# Patient Record
Sex: Male | Born: 1960
Health system: Southern US, Community
[De-identification: ages and names within clinical notes are randomized; demographics above are authoritative.]

## PROBLEM LIST (undated history)

## (undated) DIAGNOSIS — I447 Left bundle-branch block, unspecified: Secondary | ICD-10-CM

## (undated) DIAGNOSIS — K219 Gastro-esophageal reflux disease without esophagitis: Secondary | ICD-10-CM

## (undated) DIAGNOSIS — F419 Anxiety disorder, unspecified: Secondary | ICD-10-CM

## (undated) DIAGNOSIS — N400 Enlarged prostate without lower urinary tract symptoms: Secondary | ICD-10-CM

## (undated) DIAGNOSIS — I209 Angina pectoris, unspecified: Secondary | ICD-10-CM

## (undated) HISTORY — DX: Gastro-esophageal reflux disease without esophagitis: K21.9

## (undated) HISTORY — DX: Benign prostatic hyperplasia without lower urinary tract symptoms: N40.0

## (undated) HISTORY — DX: Anxiety disorder, unspecified: F41.9

## (undated) HISTORY — PX: CARDIAC CATHETERIZATION: SHX172

---

## 2005-06-10 ENCOUNTER — Encounter: Admission: RE | Admit: 2005-06-10 | Discharge: 2005-06-10 | Payer: Self-pay | Admitting: Internal Medicine

## 2012-09-14 ENCOUNTER — Other Ambulatory Visit: Payer: Self-pay | Admitting: Interventional Cardiology

## 2012-09-19 ENCOUNTER — Encounter (HOSPITAL_BASED_OUTPATIENT_CLINIC_OR_DEPARTMENT_OTHER)
Admission: RE | Disposition: A | Discharge status: Home / Self Care | Payer: Self-pay | Source: Ambulatory Visit | Attending: Interventional Cardiology

## 2012-09-19 ENCOUNTER — Inpatient Hospital Stay (HOSPITAL_BASED_OUTPATIENT_CLINIC_OR_DEPARTMENT_OTHER)
Admission: RE | Admit: 2012-09-19 | Discharge: 2012-09-19 | Disposition: A | Discharge status: Home / Self Care | Payer: 59 | Source: Ambulatory Visit | Attending: Interventional Cardiology | Admitting: Interventional Cardiology

## 2012-09-19 ENCOUNTER — Encounter (HOSPITAL_BASED_OUTPATIENT_CLINIC_OR_DEPARTMENT_OTHER): Payer: Self-pay | Admitting: Interventional Cardiology

## 2012-09-19 DIAGNOSIS — I447 Left bundle-branch block, unspecified: Secondary | ICD-10-CM | POA: Insufficient documentation

## 2012-09-19 DIAGNOSIS — I209 Angina pectoris, unspecified: Secondary | ICD-10-CM | POA: Insufficient documentation

## 2012-09-19 DIAGNOSIS — I2510 Atherosclerotic heart disease of native coronary artery without angina pectoris: Secondary | ICD-10-CM | POA: Insufficient documentation

## 2012-09-19 HISTORY — DX: Angina pectoris, unspecified: I20.9

## 2012-09-19 HISTORY — DX: Left bundle-branch block, unspecified: I44.7

## 2012-09-19 SURGERY — JV LEFT HEART CATHETERIZATION WITH CORONARY ANGIOGRAM
Anesthesia: Moderate Sedation

## 2012-09-19 MED ORDER — SODIUM CHLORIDE 0.9 % IV SOLN: 250.0000 mL | INTRAVENOUS | Status: DC | PRN

## 2012-09-19 MED ORDER — ASPIRIN 81 MG PO CHEW: 81.0000 mg | CHEWABLE_TABLET | Freq: Every day | ORAL | Status: DC

## 2012-09-19 MED ORDER — DIAZEPAM 5 MG PO TABS
5.0000 mg | ORAL_TABLET | ORAL | Status: AC
  Administered 2012-09-19: 5 mg via ORAL

## 2012-09-19 MED ORDER — SODIUM CHLORIDE 0.9 % IV SOLN: INTRAVENOUS | Status: DC

## 2012-09-19 MED ORDER — SODIUM CHLORIDE 0.9 % IJ SOLN: 3.0000 mL | INTRAMUSCULAR | Status: DC | PRN

## 2012-09-19 MED ORDER — ACETAMINOPHEN 325 MG PO TABS: 650.0000 mg | ORAL_TABLET | ORAL | Status: DC | PRN

## 2012-09-19 MED ORDER — SODIUM CHLORIDE 0.9 % IJ SOLN: 3.0000 mL | Freq: Two times a day (BID) | INTRAMUSCULAR | Status: DC

## 2012-09-19 MED ORDER — ASPIRIN 81 MG PO CHEW
324.0000 mg | CHEWABLE_TABLET | ORAL | Status: AC
  Administered 2012-09-19: 324 mg via ORAL

## 2012-09-19 MED ORDER — ONDANSETRON HCL 4 MG/2ML IJ SOLN: 4.0000 mg | Freq: Four times a day (QID) | INTRAMUSCULAR | Status: DC | PRN

## 2012-09-19 MED ORDER — ONDANSETRON HCL 4 MG/2ML IJ SOLN
4.0000 mg | Freq: Three times a day (TID) | INTRAMUSCULAR | Status: DC | PRN
  Administered 2012-09-19: 4 mg via INTRAVENOUS

## 2012-09-19 NOTE — OR Nursing (Signed)
Dr Varanasi at bedside to discuss results and treatment plan with pt and family 

## 2012-09-19 NOTE — H&P (Signed)
  Date of Initial H&P: 09-12-2012  History reviewed, patient examined, no change in status, stable for surgery.

## 2012-09-19 NOTE — OR Nursing (Signed)
+  Allen's test right hand 

## 2012-09-19 NOTE — OR Nursing (Signed)
Tegaderm dressing applied, site level 0, bedrest begins at 1015 

## 2012-09-19 NOTE — CV Procedure (Signed)
PROCEDURE:  Left heart catheterization with selective coronary angiography, left ventriculogram.  Abdominal aortogram.  INDICATIONS:  Angina with exercise; rate dependent left bundle block  The risks, benefits, and details of the procedure were explained to the patient.  The patient verbalized understanding and wanted to proceed.  Informed written consent was obtained.  PROCEDURE TECHNIQUE:  After Xylocaine anesthesia a 58F sheath was placed in the right femoral artery with a single anterior needle wall stick.   Left coronary angiography was done using a Judkins L4 guide catheter.  Right coronary angiography was done using a Judkins R4 guide catheter.  Left ventriculography and aortography was done using a pigtail catheter.    CONTRAST:  Total of 100 cc.  COMPLICATIONS:  None.    HEMODYNAMICS:  Aortic pressure was 122/69; LV pressure was 11/13; LVEDP 13.  There was no gradient between the left ventricle and aorta.    ANGIOGRAPHIC DATA:   The left main coronary artery is short but widely patent.  The left anterior descending artery is a large vessel with mild atherosclerosis proximally.  First diagonal is medium sized and patent.  Second diagonal is large and patent.  The left circumflex artery is a large vessel which appears angiographically normal.  OM1 is large and widely patent.  Small continuation branch of the circumflex.  The right coronary artery is a large dominant vessel with mild atherosclerosis in the mid vessel.  PDA is medium sized and patent  LEFT VENTRICULOGRAM:  Left ventricular angiogram was done in the 30 RAO projection and revealed normal left ventricular wall motion and systolic function with an estimated ejection fraction of 60%.  LVEDP was 13 mmHg.  ABDOMINAL AORTOGRAM: No abdominal aortic aneurysm.  Dual arterial supply to the right kidney, both branches are patent.  Single left renal artery which is widely patent.  Aortoiliac bifurcation is widely  patent.  IMPRESSIONS:  1. Normal left main coronary artery. 2. Mild atherosclerosis in the left anterior descending artery.  Widely patent branches. 3. Normal left circumflex artery and its branches. 4. Minimal atherosclerosis in the right coronary artery. 5. Normal left ventricular systolic function.  LVEDP 13 mmHg.  Ejection fraction 60%.  RECOMMENDATION:  Continue preventive therapy.

## 2012-09-19 NOTE — OR Nursing (Signed)
Discharge instructions reviewed and signed, pt stated understanding, ambulated in hall without difficulty, site level 0, transported to wife's car via wheelchair 

## 2012-12-20 ENCOUNTER — Encounter: Payer: Self-pay | Admitting: *Deleted

## 2012-12-20 ENCOUNTER — Encounter: Payer: Self-pay | Admitting: Interventional Cardiology

## 2012-12-20 DIAGNOSIS — F419 Anxiety disorder, unspecified: Secondary | ICD-10-CM | POA: Insufficient documentation

## 2012-12-22 ENCOUNTER — Ambulatory Visit: Payer: 59 | Admitting: Interventional Cardiology

## 2014-02-20 ENCOUNTER — Encounter: Payer: Self-pay | Admitting: Interventional Cardiology

## 2016-06-16 ENCOUNTER — Other Ambulatory Visit: Payer: Self-pay | Admitting: Gastroenterology

## 2016-06-16 DIAGNOSIS — R1310 Dysphagia, unspecified: Secondary | ICD-10-CM | POA: Diagnosis not present

## 2016-06-16 DIAGNOSIS — R1010 Upper abdominal pain, unspecified: Secondary | ICD-10-CM | POA: Diagnosis not present

## 2016-06-16 DIAGNOSIS — R1319 Other dysphagia: Secondary | ICD-10-CM

## 2016-06-18 ENCOUNTER — Ambulatory Visit
Admission: RE | Admit: 2016-06-18 | Discharge: 2016-06-18 | Disposition: A | Discharge status: Home / Self Care | Payer: 59 | Source: Ambulatory Visit | Attending: Gastroenterology | Admitting: Gastroenterology

## 2016-06-18 DIAGNOSIS — R1310 Dysphagia, unspecified: Secondary | ICD-10-CM

## 2016-06-18 DIAGNOSIS — R1319 Other dysphagia: Secondary | ICD-10-CM

## 2016-06-18 DIAGNOSIS — K219 Gastro-esophageal reflux disease without esophagitis: Secondary | ICD-10-CM | POA: Diagnosis not present

## 2016-08-12 DIAGNOSIS — Z23 Encounter for immunization: Secondary | ICD-10-CM | POA: Diagnosis not present

## 2016-08-12 DIAGNOSIS — Z0000 Encounter for general adult medical examination without abnormal findings: Secondary | ICD-10-CM | POA: Diagnosis not present

## 2016-08-12 DIAGNOSIS — Z136 Encounter for screening for cardiovascular disorders: Secondary | ICD-10-CM | POA: Diagnosis not present

## 2016-12-27 DIAGNOSIS — I10 Essential (primary) hypertension: Secondary | ICD-10-CM | POA: Diagnosis not present

## 2016-12-27 DIAGNOSIS — Z23 Encounter for immunization: Secondary | ICD-10-CM | POA: Diagnosis not present

## 2017-03-29 DIAGNOSIS — B079 Viral wart, unspecified: Secondary | ICD-10-CM | POA: Diagnosis not present

## 2017-03-29 DIAGNOSIS — L821 Other seborrheic keratosis: Secondary | ICD-10-CM | POA: Diagnosis not present

## 2017-03-29 DIAGNOSIS — L570 Actinic keratosis: Secondary | ICD-10-CM | POA: Diagnosis not present

## 2017-04-26 DIAGNOSIS — B079 Viral wart, unspecified: Secondary | ICD-10-CM | POA: Diagnosis not present

## 2017-08-18 DIAGNOSIS — J301 Allergic rhinitis due to pollen: Secondary | ICD-10-CM | POA: Diagnosis not present

## 2017-08-18 DIAGNOSIS — H903 Sensorineural hearing loss, bilateral: Secondary | ICD-10-CM | POA: Diagnosis not present

## 2017-08-18 DIAGNOSIS — H9312 Tinnitus, left ear: Secondary | ICD-10-CM | POA: Diagnosis not present

## 2017-09-12 DIAGNOSIS — I10 Essential (primary) hypertension: Secondary | ICD-10-CM | POA: Diagnosis not present

## 2017-09-12 DIAGNOSIS — Z0000 Encounter for general adult medical examination without abnormal findings: Secondary | ICD-10-CM | POA: Diagnosis not present

## 2017-12-21 ENCOUNTER — Emergency Department (HOSPITAL_COMMUNITY)
Admission: EM | Admit: 2017-12-21 | Discharge: 2017-12-21 | Disposition: A | Discharge status: Home / Self Care | Payer: 59 | Attending: Emergency Medicine | Admitting: Emergency Medicine

## 2017-12-21 ENCOUNTER — Encounter (HOSPITAL_COMMUNITY): Payer: Self-pay | Admitting: Emergency Medicine

## 2017-12-21 ENCOUNTER — Emergency Department (HOSPITAL_COMMUNITY): Payer: 59

## 2017-12-21 DIAGNOSIS — K0889 Other specified disorders of teeth and supporting structures: Secondary | ICD-10-CM | POA: Diagnosis not present

## 2017-12-21 DIAGNOSIS — I10 Essential (primary) hypertension: Secondary | ICD-10-CM | POA: Diagnosis not present

## 2017-12-21 DIAGNOSIS — R6884 Jaw pain: Secondary | ICD-10-CM | POA: Diagnosis not present

## 2017-12-21 DIAGNOSIS — Z79899 Other long term (current) drug therapy: Secondary | ICD-10-CM | POA: Diagnosis not present

## 2017-12-21 DIAGNOSIS — Z7982 Long term (current) use of aspirin: Secondary | ICD-10-CM | POA: Diagnosis not present

## 2017-12-21 LAB — BASIC METABOLIC PANEL
Anion gap: 8 (ref 5–15)
BUN: 14 mg/dL (ref 6–20)
CO2: 27 mmol/L (ref 22–32)
Calcium: 9.3 mg/dL (ref 8.9–10.3)
Chloride: 104 mmol/L (ref 98–111)
Creatinine, Ser: 1.11 mg/dL (ref 0.61–1.24)
GFR calc Af Amer: >60 mL/min (ref >60)
GFR calc non Af Amer: >60 mL/min (ref >60)
GLUCOSE: 100 mg/dL — AB (ref 70–99)
Potassium: 3.6 mmol/L (ref 3.5–5.1)
Sodium: 139 mmol/L (ref 135–145)

## 2017-12-21 LAB — CBC
HCT: 46.9 % (ref 39.0–52.0)
Hemoglobin: 15.1 g/dL (ref 13.0–17.0)
MCH: 28.1 pg (ref 26.0–34.0)
MCHC: 32.2 g/dL (ref 30.0–36.0)
MCV: 87.3 fL (ref 80.0–100.0)
NRBC: 0 % (ref 0.0–0.2)
PLATELETS: 231 10*3/uL (ref 150–400)
RBC: 5.37 MIL/uL (ref 4.22–5.81)
RDW: 11.2 % — ABNORMAL LOW (ref 11.5–15.5)
WBC: 7 10*3/uL (ref 4.0–10.5)

## 2017-12-21 LAB — I-STAT TROPONIN, ED: Troponin i, poc: 0.01 ng/mL (ref 0.00–0.08)

## 2017-12-21 MED ORDER — HYDROCODONE-ACETAMINOPHEN 5-325 MG PO TABS
1.0000 | ORAL_TABLET | Freq: Once | ORAL | Status: AC
  Administered 2017-12-21: 1 via ORAL
  Filled 2017-12-21: qty 1

## 2017-12-21 NOTE — ED Provider Notes (Signed)
Patient placed in Quick Look pathway, seen and evaluated   Chief Complaint:   HPI:   Pt complains of dental pain and swelling.  Pt saw dentist and had an abnormal EKg.  Pt seen at [facility] and sent here for abnormal ekg  ROS: no chest pain  Physical Exam:   Gen: No distress  Neuro: Awake and Alert  Skin: Warm    Focused Exam: swollen left face, Heart RRR   Initiation of care has begun. The patient has been counseled on the process, plan, and necessity for staying for the completion/evaluation, and the remainder of the medical screening examination   Osie Cheeks 12/21/17 1623    Virgina Norfolk, DO 12/22/17 [phone]

## 2017-12-21 NOTE — ED Provider Notes (Signed)
[facility] EMERGENCY DEPARTMENT Provider Note   CSN: [id number] Arrival date & time: 12/21/17  1610     History   Chief Complaint Chief Complaint  Patient presents with  . Jaw Pain  . Hypertension  . Dental Pain    HPI Blake Erickson is a 57 y.o. male.  The history is provided by the patient and medical records. No language interpreter was used.  Hypertension   Dental Pain       Blake Erickson is a 57 y.o. male who presents to the Emergency Department complaining of left lower dental pain over the last week.  Symptoms improve when he puts something cold to the area such as ice or cold water in his mouth.  Gets worse with palpation or chewing.  He saw the dentist Tuesday who did not see anything to cause his pain, however sent him to a "dental specialist".  He went there today.  He states that he became very anxious and was in a lot of pain while at the dentist office.  He was not having any chest pain or shortness of breath.  He states that he was just frustrated because of the pain.  The dentist took an EKG and said that it was abnormal, therefore sent him to the [facility] walk-in clinic.  He states that at the walk-in clinic, they told him that he had a left bundle branch block which they did not see on his last EKG (he believes the last EKG was in 2014).  Because of his new EKG changes, walk-in clinic recommended that he come to the ER for further evaluation.  He denies any chest pain or shortness of breath.  He states that he exercised for a long time last night without any symptoms whatsoever.  This did not exacerbate his jaw pain.    Past Medical History:  Diagnosis Date  . Anxiety   . BPH (benign prostatic hyperplasia)   . GERD (gastroesophageal reflux disease)   . LBBB (left bundle branch block)   . Other and unspecified angina pectoris     Patient Active Problem List   Diagnosis Date Noted  . Anxiety   . Other and unspecified angina pectoris   .  LBBB (left bundle branch block)     Past Surgical History:  Procedure Laterality Date  . CARDIAC CATHETERIZATION     [facility] Medications    Prior to Admission medications   Medication Sig Start Date End Date Taking? Authorizing Provider  Ascorbic Acid (VITAMIN C) 100 MG tablet Take 100 mg by mouth daily.    [provider]  aspirin 81 MG tablet Take 81 mg by mouth daily.    [provider]  meclizine (ANTIVERT) 25 MG tablet Take 25 mg by mouth 3 (three) times daily as needed for dizziness.    [provider]  Multiple Vitamin (MULTIVITAMIN) capsule Take 1 capsule by mouth daily.    [provider]  multivitamin-lutein (OCUVITE-LUTEIN) CAPS capsule Take 1 capsule by mouth daily.    [provider]  Omega-3 Fatty Acids (FISH OIL PO) Take 1 tablet by mouth daily.    [provider]  PARoxetine (PAXIL) 10 MG tablet Take 10 mg by mouth every morning.    [provider]  saw palmetto 500 MG capsule Take 500 mg by mouth daily.    [provider]  vitamin E 100 UNIT capsule Take 100 Units by  mouth daily.    [provider]    Family History Family History  Problem Relation Age of Onset  . CAD Father   . Leukemia Mother   . Hypertension Brother     Social History Social History   Tobacco Use  . Smoking status: Never Smoker  Substance Use Topics  . Alcohol use: Not Currently    Alcohol/week: 0.0 standard drinks  . Drug use: Not Currently     Allergies   Patient has no known allergies.   Review of Systems Review of Systems  HENT: Positive for dental problem.   All other systems reviewed and are negative.    Physical Exam Updated Vital Signs BP (!) 166/91   Pulse 71   Temp 98.3 F (36.8 C) (Oral)   Resp 12   Ht 5\' 9"  (1.753 m)   Wt 66.7 kg   SpO2 100%   BMI 21.71 kg/m   Physical Exam  Constitutional: He is oriented to person, place, and time. He appears  well-developed and well-nourished. No distress.  HENT:  Head: Normocephalic and atraumatic.  Mouth/Throat:    Pain along teeth as depicted in image. Mild overlying facial edema. No abscess noted. Midline uvula. No trismus. OP moist and clear. No oropharyngeal erythema or edema. Neck supple with no tenderness.   Neck: No JVD present.  Cardiovascular: Normal rate, regular rhythm and normal heart sounds.  No murmur heard. Pulmonary/Chest: Effort normal and breath sounds normal. No respiratory distress.  Abdominal: Soft. He exhibits no distension. There is no tenderness.  Musculoskeletal: He exhibits no edema.  Neurological: He is alert and oriented to person, place, and time.  Skin: Skin is warm and dry.  Nursing note and vitals reviewed.    ED Treatments / Results  Labs (all labs ordered are listed, but only abnormal results are displayed) Labs Reviewed  BASIC METABOLIC PANEL - Abnormal; Notable for the following components:      Result Value   Glucose, Bld 100 (*)    All other components within normal limits  CBC - Abnormal; Notable for the following components:   RDW 11.2 (*)    All other components within normal limits  I-STAT TROPONIN, ED    EKG EKG Interpretation  Date/Time:  Wednesday December 21 2017 16:39:21 EDT Ventricular Rate:  72 PR Interval:  168 QRS Duration: 78 QT Interval:  382 QTC Calculation: 418 R Axis:   39 Text Interpretation:  Normal sinus rhythm with sinus arrhythmia Normal ECG When compared to prior, new S1Q3T3 pattern.  No STEMI Confirmed by Theda Belfast ([id number]) on 12/21/2017 5:09:58 PM   Radiology Dg Chest 2 View  Result Date: 12/21/2017 CLINICAL DATA:  Left jaw pain over the last week. EXAM: CHEST - 2 VIEW COMPARISON:  None. FINDINGS: No appreciable gas in the neck or mediastinum. Cardiac and mediastinal margins appear normal. The lungs appear clear. No pleural effusion. No significant bony abnormality is identified. IMPRESSION: 1.  No  significant abnormality identified. Electronically Signed   By: Gaylyn Rong M.D.   On: 12/21/2017 17:09    Procedures Procedures (including critical care time)  Medications Ordered in ED Medications  HYDROcodone-acetaminophen (NORCO/VICODIN) 5-325 MG per tablet 1 tablet (1 tablet Oral Given 12/21/17 1856)     Initial Impression / Assessment and Plan / ED Course  I have reviewed the triage vital signs and the nursing notes.  Pertinent labs & imaging results that were available during my care of the patient were reviewed by me  and considered in my medical decision making (see chart for details).    Blake Erickson is a 57 y.o. male who presents to ED from [facility] walk in clinic for further evaluation after abnormal EKG was performed.  She went to his dentist office where her EKG was done.  He was then referred to the [facility] walk-in clinic.  Per patient, [facility] walk-in clinic did an EKG which showed a left bundle branch block.  This appeared to be new when compared to his previous EKGs.  On exam today, patient is afebrile, hemodynamically stable with normal cardiopulmonary examination.  He never had any chest pain or shortness of breath.  His jaw pain does not seem to be a cardiac equivalent.  He has tenderness to the dentition and swelling to the jaw.  His jaw pain is not worsened with exertion.  It appears to be dental in etiology.  He is scheduled to follow-up with his dentist tomorrow for this.  EKG was performed in the ER today and reviewed with attending, Dr. Rush Landmark.  He does not have a left bundle branch block here.  Troponin normal.  Chest x-ray without acute findings.  On reevaluation, he still is asymptomatic other than his tooth hurting. Evaluation does not show pathology that would require ongoing emergent intervention or inpatient treatment.  We will have him follow-up with cardiology as an outpatient.  Reasons to return to the ER including development of chest pain, shortness of  breath, sudden sweating, etc. were discussed and all questions answered.  Patient discussed with Dr. Rush Landmark who agrees with treatment plan.    Final Clinical Impressions(s) / ED Diagnoses   Final diagnoses:  Pain, dental    ED Discharge Orders    None       Inanna Telford, Chase Picket, PA-C 12/21/17 1859    Tegeler, Canary Brim, MD 12/22/17 [phone]

## 2017-12-21 NOTE — Discharge Instructions (Addendum)
It was my pleasure taking care of you today!   Please call the cardiology office in the morning to schedule a follow up appointment about your EKG.  Return to ER for any chest pain, shortness of breath, sudden sweating, new or worsening symptoms, any additional concerns.

## 2017-12-21 NOTE — ED Triage Notes (Signed)
Pt presents with L jaw pain and headache for 5 days, was seen at dentist, who referred him to [facility], who referred him to ER for EKG changes; pt denies CP, sob

## 2018-02-21 DIAGNOSIS — Z8249 Family history of ischemic heart disease and other diseases of the circulatory system: Secondary | ICD-10-CM | POA: Diagnosis not present

## 2018-02-21 DIAGNOSIS — R9431 Abnormal electrocardiogram [ECG] [EKG]: Secondary | ICD-10-CM | POA: Diagnosis not present

## 2018-03-21 ENCOUNTER — Ambulatory Visit: Payer: 59 | Admitting: Cardiology

## 2018-04-14 ENCOUNTER — Ambulatory Visit (INDEPENDENT_AMBULATORY_CARE_PROVIDER_SITE_OTHER): Payer: 59 | Admitting: Interventional Cardiology

## 2018-04-14 ENCOUNTER — Encounter

## 2018-04-14 ENCOUNTER — Encounter: Payer: Self-pay | Admitting: Interventional Cardiology

## 2018-04-14 ENCOUNTER — Ambulatory Visit: Payer: 59 | Admitting: Interventional Cardiology

## 2018-04-14 VITALS — BP 150/80 | HR 75 | Ht 69.0 in | Wt 153.2 lb

## 2018-04-14 DIAGNOSIS — I447 Left bundle-branch block, unspecified: Secondary | ICD-10-CM | POA: Diagnosis not present

## 2018-04-14 DIAGNOSIS — I10 Essential (primary) hypertension: Secondary | ICD-10-CM

## 2018-04-14 DIAGNOSIS — Z8249 Family history of ischemic heart disease and other diseases of the circulatory system: Secondary | ICD-10-CM

## 2018-04-14 NOTE — Progress Notes (Signed)
Cardiology Office Note   Date:  04/14/2018   ID:  Blake Erickson, DOB 06-01-1960, MRN [id number]  PCP:  Renford Dills, MD    No chief complaint on file.  Abnormal ECG  Wt Readings from Last 3 Encounters:  12/21/17 147 lb (66.7 kg)  09/19/12 154 lb (69.9 kg)       History of Present Illness: Blake Erickson is a 58 y.o. male who is being seen today for the evaluation of abnormal ECG at the request of Renford Dills, MD.  He has a strong family history of coronary artery disease.  He was found to have a new LBBB on a routine ECG in 2019.  He is known to the emergency room due to an abscessed tooth.  He was under a lot of stress at the time.  Troponins were negative.  2014 cath showed:  1. Normal left main coronary artery. 2. Mild atherosclerosis in the left anterior descending artery.  Widely patent branches. 3. Normal left circumflex artery and its branches. 4. Minimal atherosclerosis in the right coronary artery. 5. Normal left ventricular systolic function.  LVEDP 13 mmHg.  Ejection fraction 60%.  Denies : Chest pain. Dizziness. Leg edema. Nitroglycerin use. Orthopnea. Palpitations. Paroxysmal nocturnal dyspnea. Shortness of breath. Syncope.   He continues to exercise at a high level.  He uses the treadmill regularly.  No symptoms with exercise.    Past Medical History:  Diagnosis Date  . Anxiety   . BPH (benign prostatic hyperplasia)   . GERD (gastroesophageal reflux disease)   . LBBB (left bundle branch block)   . Other and unspecified angina pectoris     Past Surgical History:  Procedure Laterality Date  . CARDIAC CATHETERIZATION     Acelyn Basham     Current Outpatient Medications  Medication Sig Dispense Refill  . Ascorbic Acid (VITAMIN C) 100 MG tablet Take 100 mg by mouth daily.    Marland Kitchen aspirin 81 MG tablet Take 81 mg by mouth daily.    Marland Kitchen losartan (COZAAR) 25 MG tablet Take 25 mg by mouth daily. take 1.5 tablets by mouth daily.    . Omega-3 Fatty Acids  (FISH OIL PO) Take 1 tablet by mouth daily.    . saw palmetto 500 MG capsule Take 500 mg by mouth daily.    . vitamin E 100 UNIT capsule Take 100 Units by mouth daily.     No current facility-administered medications for this visit.     Allergies:   Patient has no known allergies.    Social History:  The patient  reports that he has never smoked. He does not have any smokeless tobacco history on file. He reports previous alcohol use. He reports previous drug use.   Family History:  The patient'sfamily history includes CAD in his father; Hypertension in his brother; Leukemia in his mother.    ROS:  Please see the history of present illness.   Otherwise, review of systems are positive for one episode of lightheadedness.   All other systems are reviewed and negative.    PHYSICAL EXAM: VS:  Ht 5\' 9"  (1.753 m)   BMI 21.71 kg/m  , BMI Body mass index is 21.71 kg/m. GEN: Well nourished, well developed, in no acute distress  HEENT: normal  Neck: no JVD, carotid bruits, or masses Cardiac: RRR; no murmurs, rubs, or gallops,no edema  Respiratory:  clear to auscultation bilaterally, normal work of breathing GI: soft, nontender, nondistended, + BS MS: no deformity or  atrophy  Skin: warm and dry, no rash Neuro:  Strength and sensation are intact Psych: euthymic mood, full affect   EKG:   The ekgs ordered in 10/19 demonstrates transient widening of QRS.  Otherwise narrow QRS on repeat.   Recent Labs: 12/21/2017: BUN 14; Creatinine, Ser 1.11; Hemoglobin 15.1; Platelets 231; Potassium 3.6; Sodium 139   Lipid Panel No results found for: CHOL, TRIG, HDL, CHOLHDL, VLDL, LDLCALC, LDLDIRECT   Other studies Reviewed: Additional studies/ records that were reviewed today with results demonstrating: ECG as above. LDL 75 in 7/19.   ASSESSMENT AND PLAN:  1. Transient LBBB: Resolved on a subsequent ECG.  Troponins were negative at that time.  I explained to him that the block that they were  referring to on the ECG is different from a blockage in the arteries.  He was reassured by this information. 2. His exercise tolerance is excellent.  He had a cardiac cath in 2014 showing only minimal atherosclerosis.  We discussed repeat stress test given his family history.  He feels that he is exercising at a high level without any problems.  He will hold off and let us know if there are any changes.  I think this is reasonable. 3. Hypertension: Continue losartan.  Blood pressure is elevated at times.  Typically is higher with stress.  Asked him to check what his blood pressure is doing when he is more relaxed at home.  He states that even at most doctors appointments, his blood pressure is well controlled.   Current medicines are reviewed at length with the patient today.  The patient concerns regarding his medicines were addressed.  The following changes have been made:  No change  Labs/ tests ordered today include:  No orders of the defined types were placed in this encounter.   Recommend 150 minutes/week of aerobic exercise Low fat, low carb, high fiber diet recommended  Disposition:   FU in 2 years   Signed, Lance Muss, MD  04/14/2018 10:36 AM    [facility] [address] [place], [place], [place]  [address] Phone: [phone]; Fax: [phone]

## 2018-04-14 NOTE — Patient Instructions (Signed)
Medication Instructions:  Your physician recommends that you continue on your current medications as directed. Please refer to the Current Medication list given to you today.  If you need a refill on your cardiac medications before your next appointment, please call your pharmacy.   Lab work: None Ordered  If you have labs (blood work) drawn today and your tests are completely normal, you will receive your results only by: Marland Kitchen MyChart Message (if you have MyChart) OR . A paper copy in the mail If you have any lab test that is abnormal or we need to change your treatment, we will call you to review the results.  Testing/Procedures: None ordered  Follow-Up: At [facility], you and your health needs are our priority.  As part of our continuing mission to provide you with exceptional heart care, we have created designated Provider Care Teams.  These Care Teams include your primary Cardiologist (physician) and Advanced Practice Providers (APPs -  Physician Assistants and Nurse Practitioners) who all work together to provide you with the care you need, when you need it. . You will need a follow up appointment in 2 years.  Please call our office 2 months in advance to schedule this appointment.  You may see Everette Rank, MD or one of the following Advanced Practice Providers on your designated Care Team:   . Robbie Lis, PA-C . Dayna Dunn, PA-C . Jacolyn Reedy, PA-C  Any Other Special Instructions Will Be Listed Below (If Applicable).

## 2018-06-09 ENCOUNTER — Ambulatory Visit: Payer: 59 | Admitting: Interventional Cardiology

## 2021-01-03 ENCOUNTER — Emergency Department (HOSPITAL_COMMUNITY)
Admission: EM | Admit: 2021-01-03 | Discharge: 2021-01-04 | Disposition: A | Discharge status: Home / Self Care | Payer: 59 | Attending: Student | Admitting: Student

## 2021-01-03 ENCOUNTER — Encounter (HOSPITAL_COMMUNITY): Payer: Self-pay | Admitting: *Deleted

## 2021-01-03 ENCOUNTER — Other Ambulatory Visit: Payer: Self-pay

## 2021-01-03 ENCOUNTER — Emergency Department (HOSPITAL_COMMUNITY): Payer: 59

## 2021-01-03 DIAGNOSIS — L02512 Cutaneous abscess of left hand: Secondary | ICD-10-CM | POA: Diagnosis not present

## 2021-01-03 DIAGNOSIS — L0390 Cellulitis, unspecified: Secondary | ICD-10-CM

## 2021-01-03 DIAGNOSIS — Z7982 Long term (current) use of aspirin: Secondary | ICD-10-CM | POA: Diagnosis not present

## 2021-01-03 DIAGNOSIS — M7989 Other specified soft tissue disorders: Secondary | ICD-10-CM

## 2021-01-03 LAB — CBC WITH DIFFERENTIAL/PLATELET
Abs Immature Granulocytes: 0.02 10*3/uL (ref 0.00–0.07)
Basophils Absolute: 0.1 10*3/uL (ref 0.0–0.1)
Basophils Relative: 1 %
Eosinophils Absolute: 0 10*3/uL (ref 0.0–0.5)
Eosinophils Relative: 0 %
HCT: 43.5 % (ref 39.0–52.0)
Hemoglobin: 14.8 g/dL (ref 13.0–17.0)
Immature Granulocytes: 0 %
Lymphocytes Relative: 16 %
Lymphs Abs: 1.7 10*3/uL (ref 0.7–4.0)
MCH: 29.9 pg (ref 26.0–34.0)
MCHC: 34 g/dL (ref 30.0–36.0)
MCV: 87.9 fL (ref 80.0–100.0)
Monocytes Absolute: 0.8 10*3/uL (ref 0.1–1.0)
Monocytes Relative: 8 %
Neutro Abs: 7.9 10*3/uL — ABNORMAL HIGH (ref 1.7–7.7)
Neutrophils Relative %: 75 %
Platelets: 205 10*3/uL (ref 150–400)
RBC: 4.95 MIL/uL (ref 4.22–5.81)
RDW: 11.4 % — ABNORMAL LOW (ref 11.5–15.5)
WBC: 10.5 10*3/uL (ref 4.0–10.5)
nRBC: 0 % (ref 0.0–0.2)

## 2021-01-03 LAB — BASIC METABOLIC PANEL
Anion gap: 4 — ABNORMAL LOW (ref 5–15)
BUN: 18 mg/dL (ref 8–23)
CO2: 29 mmol/L (ref 22–32)
Calcium: 9.4 mg/dL (ref 8.9–10.3)
Chloride: 102 mmol/L (ref 98–111)
Creatinine, Ser: 1.17 mg/dL (ref 0.61–1.24)
GFR, Estimated: >60 mL/min (ref >60)
Glucose, Bld: 126 mg/dL — ABNORMAL HIGH (ref 70–99)
Potassium: 4 mmol/L (ref 3.5–5.1)
Sodium: 135 mmol/L (ref 135–145)

## 2021-01-03 LAB — SEDIMENTATION RATE: Sed Rate: 3 mm/hr (ref 0–16)

## 2021-01-03 LAB — C-REACTIVE PROTEIN: CRP: 1.2 mg/dL — ABNORMAL HIGH (ref <1.0)

## 2021-01-03 NOTE — ED Provider Notes (Addendum)
Emergency Medicine Provider Triage Evaluation Note  Blake Erickson , a 60 y.o. male  was evaluated in triage.  Pt complains of gradual onset, constant, worsening, left thumb pain and swelling that began early this morning.  Patient reports that on Thursday he was cleaning a deer carcass felt gloves and nicked his left thumb twice with his knife.  He states that he cleaned the area afterwards and has been putting some Neosporin on it.  He states that this morning he began having pain and swelling.  He took a Tylenol PM around 7 PM tonight.  He states he has felt slightly feverish however has not checked his temperature.  He states that he has difficulty bending the thumb currently.  He states that earlier today he had pain shooting down the palmar aspect of the thumb into the base of the hand. Tetanus UTD.   Review of Systems  Positive: + left thumb pain/swelling, subjective fevers Negative: - drainage  Physical Exam  BP (!) 152/83 (BP Location: Right Arm)   Pulse 79   Temp 98.8 F (37.1 C) (Oral)   Resp 18   Ht 5\' 9"  (1.753 m)   Wt 69.4 kg   SpO2 98%   BMI 22.59 kg/m  Gen:   Awake, no distress   Resp:  Normal effort  MSK:   Moves extremities without difficulty  Other:  Left thumb with slight swelling at the distal aspect and tenderness palpation.  Patient has 2 superficial very small lacerations to the distal aspect of his thumb.  Active bleeding and already healing.  He has tenderness palpation along the flexor surface.  He has limited range of motion to the DIP joint of the thumb.  Cap refill less than 2 seconds.  2+ radial pulse.  Medical Decision Making  Medically screening exam initiated at 8:21 PM.  Appropriate orders placed.  was informed that the remainder of the evaluation will be completed by another provider, this initial triage assessment does not replace that evaluation, and the importance of remaining in the ED until their evaluation is complete.        Charlett Lango, PA-C 01/03/21 2024    2025, MD 01/04/21 [phone]

## 2021-01-03 NOTE — ED Triage Notes (Signed)
Pt was cleaning a dear without gloves and nicked his L thumb with with the knife.  Now site is red, swollen and painful.

## 2021-01-04 MED ORDER — BACITRACIN-NEOMYCIN-POLYMYXIN 400-5-5000 EX OINT
TOPICAL_OINTMENT | Freq: Every day | CUTANEOUS | Status: DC
  Filled 2021-01-04: qty 1

## 2021-01-04 MED ORDER — CEFADROXIL 500 MG PO CAPS: 500.0000 mg | ORAL_CAPSULE | Freq: Two times a day (BID) | ORAL | 0 refills | Status: AC

## 2021-01-04 MED ORDER — CEFADROXIL 500 MG PO CAPS
500.0000 mg | ORAL_CAPSULE | Freq: Two times a day (BID) | ORAL | Status: DC
  Administered 2021-01-04: 500 mg via ORAL
  Filled 2021-01-04: qty 1

## 2021-01-04 MED ORDER — LIDOCAINE HCL (PF) 1 % IJ SOLN
5.0000 mL | Freq: Once | INTRAMUSCULAR | Status: AC
  Administered 2021-01-04: 5 mL
  Filled 2021-01-04: qty 5

## 2021-01-04 MED ORDER — AMOXICILLIN-POT CLAVULANATE 875-125 MG PO TABS: 1.0000 | ORAL_TABLET | Freq: Two times a day (BID) | ORAL | 0 refills | Status: DC

## 2021-01-04 NOTE — ED Provider Notes (Signed)
[facility] EMERGENCY DEPARTMENT Provider Note   CSN: [id number] Arrival date & time: 01/03/21  1938     History Chief Complaint  Patient presents with   Finger Injury    Blake Erickson is a 60 y.o. male with PMH left bundle branch block, GERD, BPH who presents the emergency department for evaluation of a left thumb injury.  Patient states that 72 hours ago he was skinning a deer and cut his thumb in 2 separate places.  The thumb has since become swollen and red but he denies fever, chest pain, shortness of breath, abdominal pain, nausea, vomiting, numbness, tingling of the hand or any other systemic symptoms.  Patient has full range of motion of the thumb.  Tetanus up-to-date.  HPI     Past Medical History:  Diagnosis Date   Anxiety    BPH (benign prostatic hyperplasia)    GERD (gastroesophageal reflux disease)    LBBB (left bundle branch block)    Other and unspecified angina pectoris     Patient Active Problem List   Diagnosis Date Noted   Anxiety    Other and unspecified angina pectoris    LBBB (left bundle branch block)     Past Surgical History:  Procedure Laterality Date   CARDIAC CATHETERIZATION     [place]       Family History  Problem Relation Age of Onset   CAD Father    Leukemia Mother    Hypertension Brother     Social History   Tobacco Use   Smoking status: Never   Smokeless tobacco: Never  Substance Use Topics   Alcohol use: Not Currently    Alcohol/week: 0.0 standard drinks   Drug use: Not Currently    Home Medications Prior to Admission medications   Medication Sig Start Date End Date Taking? Authorizing Provider  Ascorbic Acid (VITAMIN C) 100 MG tablet Take 100 mg by mouth daily.    [provider]  aspirin 81 MG tablet Take 81 mg by mouth daily.    [provider]  losartan (COZAAR) 25 MG tablet Take 25 mg by mouth daily. take 1.5 tablets by mouth daily. 03/18/18   [provider]   Omega-3 Fatty Acids (FISH OIL PO) Take 1 tablet by mouth daily.    [provider]  saw palmetto 500 MG capsule Take 500 mg by mouth daily.    [provider]  vitamin E 100 UNIT capsule Take 100 Units by mouth daily.    [provider]    Allergies    Patient has no known allergies.  Review of Systems   Review of Systems  Constitutional:  Negative for chills and fever.  HENT:  Negative for ear pain and sore throat.   Eyes:  Negative for pain and visual disturbance.  Respiratory:  Negative for cough and shortness of breath.   Cardiovascular:  Negative for chest pain and palpitations.  Gastrointestinal:  Negative for abdominal pain and vomiting.  Genitourinary:  Negative for dysuria and hematuria.  Musculoskeletal:  Negative for arthralgias and back pain.       Left thumb pain  Skin:  Negative for color change and rash.  Neurological:  Negative for seizures and syncope.  All other systems reviewed and are negative.  Physical Exam Updated Vital Signs BP (!) 154/81   Pulse 76   Temp 98.8 F (37.1 C) (Oral)   Resp 16   Ht 5\' 9"  (1.753 m)   Wt 69.4  kg   SpO2 100%   BMI 22.59 kg/m   Physical Exam Vitals and nursing note reviewed.  Constitutional:      Appearance: He is well-developed.  HENT:     Head: Normocephalic and atraumatic.  Eyes:     Conjunctiva/sclera: Conjunctivae normal.  Cardiovascular:     Rate and Rhythm: Normal rate and regular rhythm.     Heart sounds: No murmur heard. Pulmonary:     Effort: Pulmonary effort is normal. No respiratory distress.     Breath sounds: Normal breath sounds.  Abdominal:     Palpations: Abdomen is soft.     Tenderness: There is no abdominal tenderness.  Musculoskeletal:        General: Swelling and tenderness (Left thumb) present.     Cervical back: Neck supple.  Skin:    General: Skin is warm and dry.     Findings: Erythema (Fluctuant erythema around the nail base of the left thumb) present.   Neurological:     Mental Status: He is alert.    ED Results / Procedures / Treatments   Labs (all labs ordered are listed, but only abnormal results are displayed) Labs Reviewed  BASIC METABOLIC PANEL - Abnormal; Notable for the following components:      Result Value   Glucose, Bld 126 (*)    Anion gap 4 (*)    All other components within normal limits  CBC WITH DIFFERENTIAL/PLATELET - Abnormal; Notable for the following components:   RDW 11.4 (*)    Neutro Abs 7.9 (*)    All other components within normal limits  C-REACTIVE PROTEIN - Abnormal; Notable for the following components:   CRP 1.2 (*)    All other components within normal limits  SEDIMENTATION RATE    EKG None  Radiology DG Finger Thumb Left  Result Date: 01/03/2021 CLINICAL DATA:  Pain, swelling, concern for flexor tenosynovitis. Cut thumb while cleaning a deer. EXAM: LEFT THUMB 2+V COMPARISON:  None. FINDINGS: There is no evidence of fracture or dislocation. No erosion, periosteal reaction, or bone destruction. There is no evidence of arthropathy or other focal bone abnormality. Soft tissues are unremarkable. No soft tissue air. No radiopaque foreign body. IMPRESSION: Unremarkable radiographs of the left thumb. Electronically Signed   By: Keith Rake M.D.   On: 01/03/2021 20:44    Procedures .Marland KitchenIncision and Drainage  Date/Time: 01/04/2021 9:57 AM Performed by: Teressa Lower, MD Authorized by: Teressa Lower, MD   Location:    Type:  Abscess   Size:  0.25   Location:  Upper extremity   Upper extremity location:  Finger   Finger location:  L thumb Pre-procedure details:    Skin preparation:  Chlorhexidine with alcohol Sedation:    Sedation type:  None Anesthesia:    Anesthesia method:  Nerve block   Block location:  Digital   Block needle gauge:  24 G   Block anesthetic:  Lidocaine 1% w/o epi   Block technique:  Ring block   Block outcome:  Anesthesia achieved Procedure type:     Complexity:  Simple Procedure details:    Incision types:  Stab incision   Drainage:  Bloody   Drainage amount:  Scant   Wound treatment:  Wound left open Post-procedure details:    Procedure completion:  Tolerated well, no immediate complications   Medications Ordered in ED Medications - No data to display  ED Course  I have reviewed the triage vital signs and the nursing notes.  Pertinent  labs & imaging results that were available during my care of the patient were reviewed by me and considered in my medical decision making (see chart for details).    MDM Rules/Calculators/A&P                           Patient seen emergency department for evaluation of a left thumb erythema and swelling.  Physical exam reveals a fluctuant area over the lateral nailbed of the left thumb with generalized swelling and erythema of the thumb past the PIP joint.  Laboratory evaluation unremarkable.  X-ray with no fracture or evidence of soft tissue gas.  Patient presentation consistent with paronychia for cellulitis.  Bedside I&D performed with expression of very minimal serosanguineous fluid.  No pus.  There does not appear to be a focal area of fluctuance on the finger pad and I have lower concern for felon at this time.  Patient was given single dose Duricef and will be discharged with hand follow-up and a prescription for Duricef. Final Clinical Impression(s) / ED Diagnoses Final diagnoses:  None    Rx / DC Orders ED Discharge Orders     None        Jessicaann Overbaugh, Wyn Forster, MD 01/04/21 [phone]

## 2021-01-04 NOTE — ED Notes (Signed)
Pt refused vitals 

## 2021-01-04 NOTE — ED Notes (Signed)
AB OINTMENT PLACE ON AFFECTED AREA.  PT EDUCATED ON FOLLOW UP CARE AND DC AMBULATING TO THE FRONT LOBBY,.

## 2023-07-07 IMAGING — CR DG FINGER THUMB 2+V*L*
3 series · 3 of 3 positions shown · non-contrast
Comparison: None.

CLINICAL DATA: Pain, swelling, concern for flexor tenosynovitis.
Cut thumb while [employer].

EXAM:
LEFT THUMB 2+V

[finger ap]
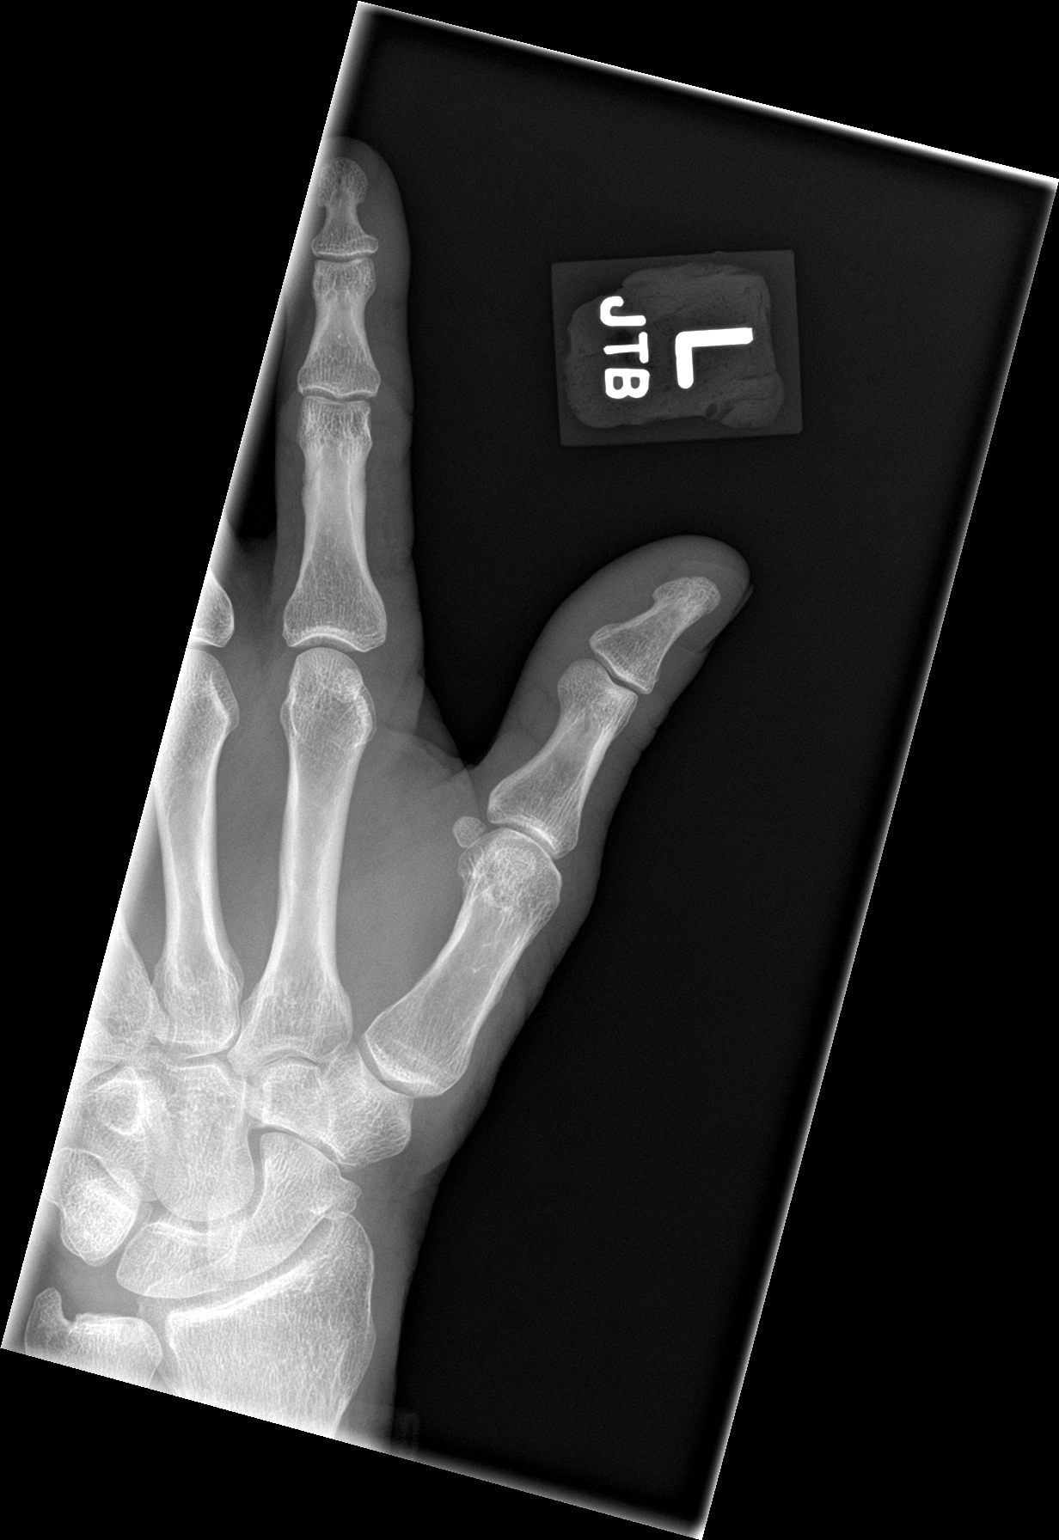

[finger obl]
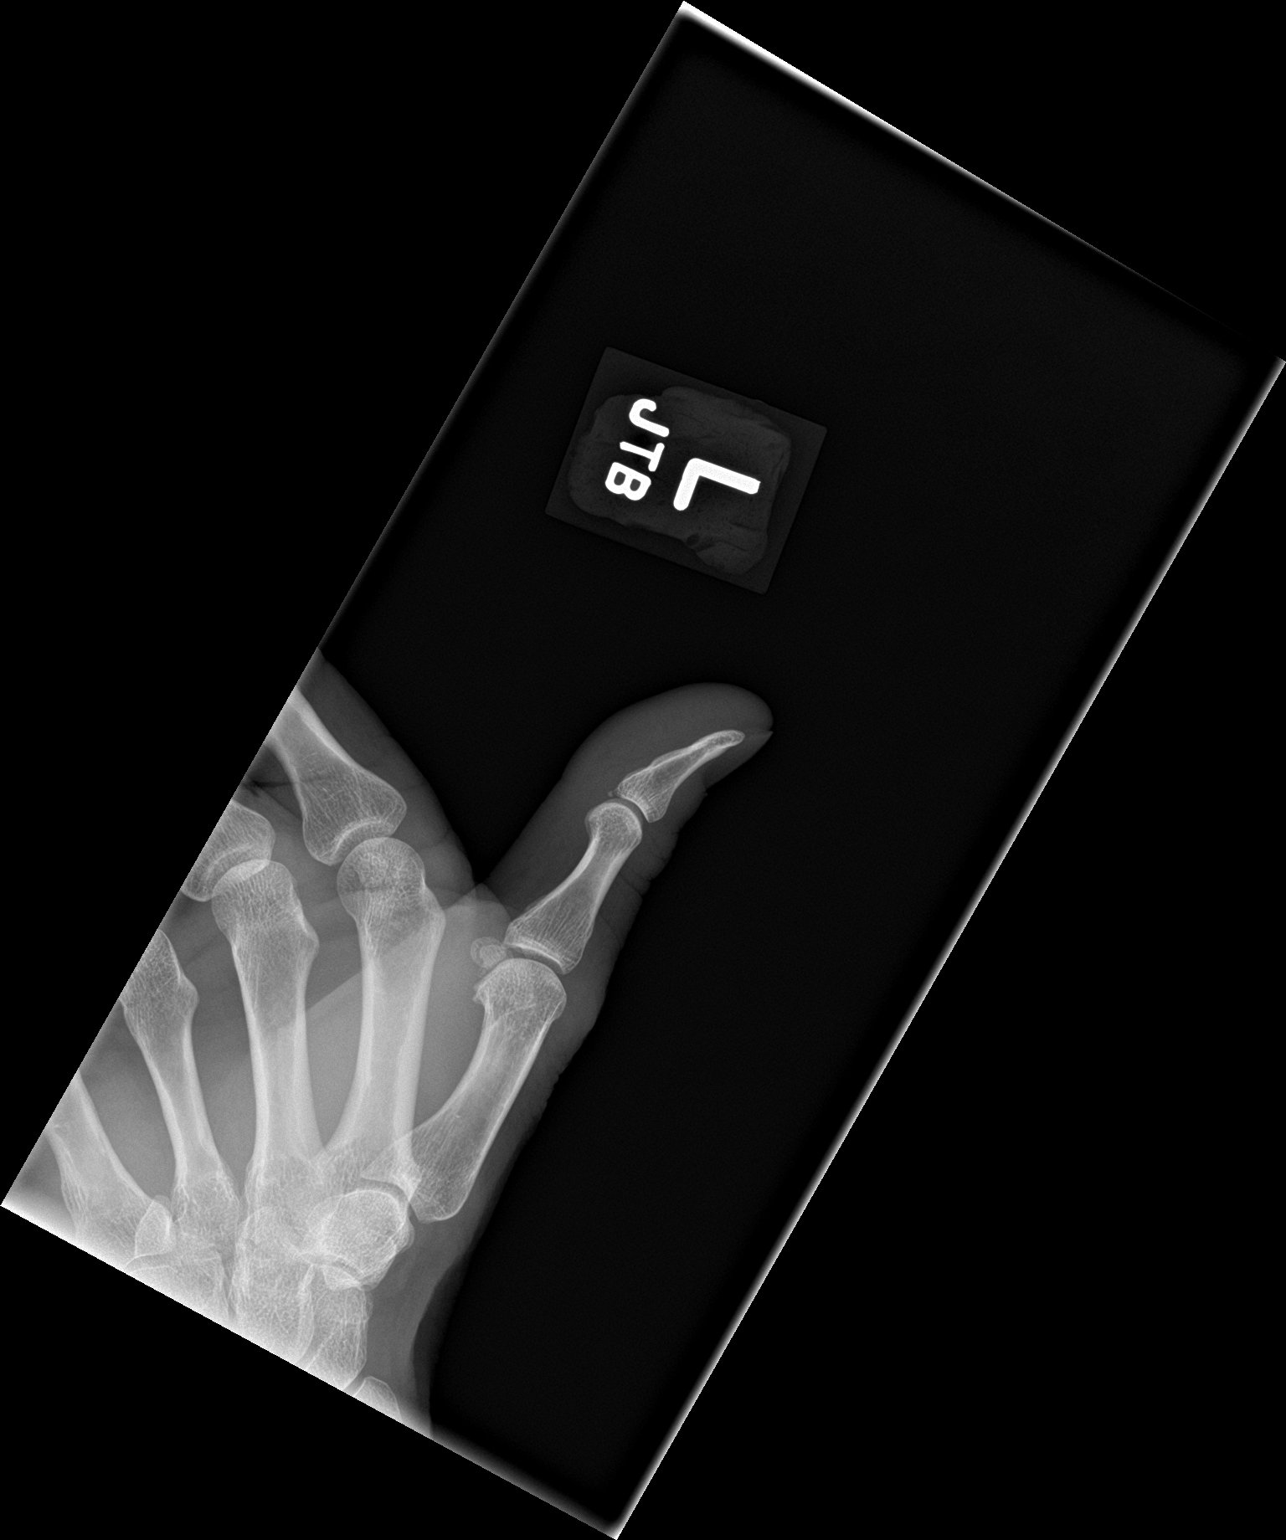

[finger lat]
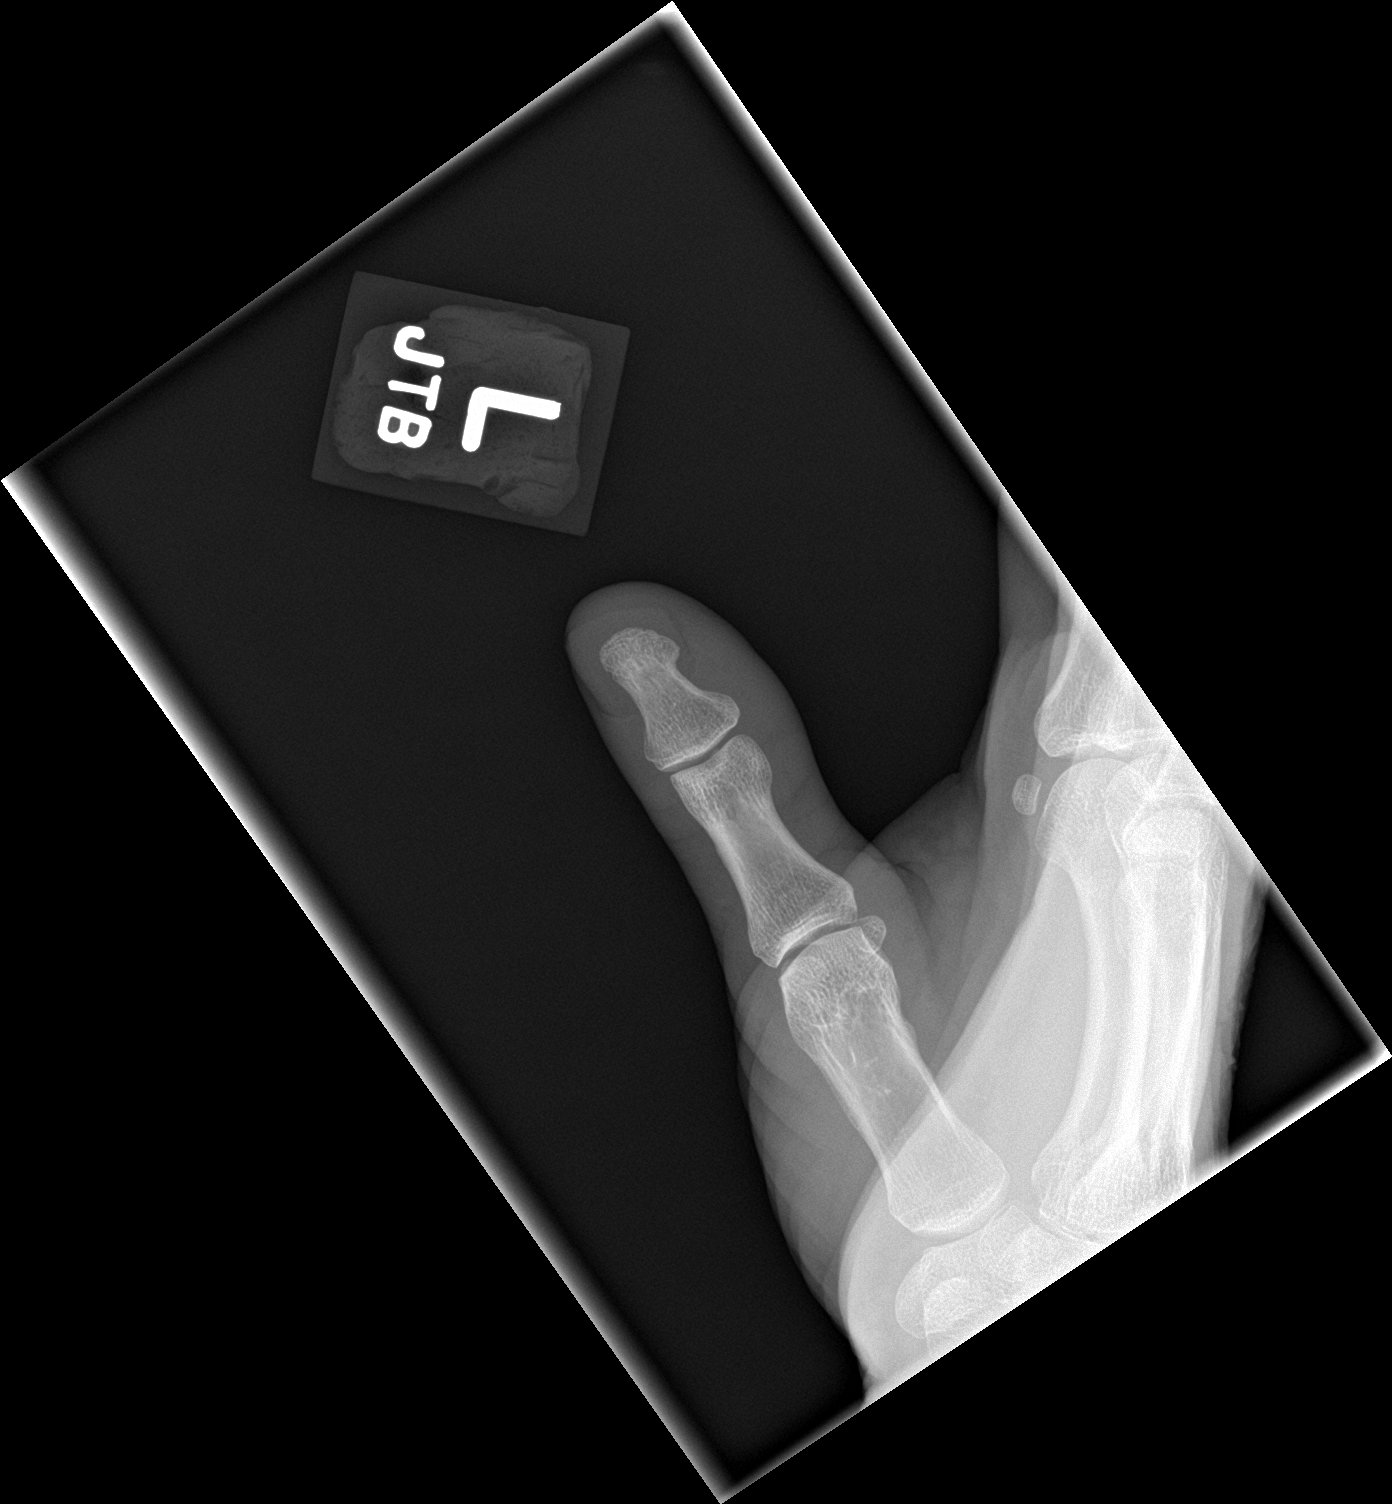

[3 of 3 positions shown; findings below may reference images not displayed]

FINDINGS: There is no evidence of fracture or dislocation. No erosion,
periosteal reaction, or bone destruction. There is no evidence of
arthropathy or other focal bone abnormality. Soft tissues are
unremarkable. No soft tissue air. No radiopaque foreign body.
IMPRESSION: Unremarkable radiographs of the left thumb.

## 2023-12-05 DIAGNOSIS — Z0000 Encounter for general adult medical examination without abnormal findings: Secondary | ICD-10-CM | POA: Diagnosis not present

## 2023-12-05 DIAGNOSIS — Z125 Encounter for screening for malignant neoplasm of prostate: Secondary | ICD-10-CM | POA: Diagnosis not present

## 2023-12-05 DIAGNOSIS — I10 Essential (primary) hypertension: Secondary | ICD-10-CM | POA: Diagnosis not present
